# Patient Record
Sex: Male | Born: 1940 | Race: White | Hispanic: No | Marital: Married | Smoking: Former smoker
Health system: Southern US, Community
[De-identification: ages and names within clinical notes are randomized; demographics above are authoritative.]

---

## 2020-09-18 DIAGNOSIS — H532 Diplopia: Secondary | ICD-10-CM | POA: Diagnosis not present

## 2020-09-18 DIAGNOSIS — M79672 Pain in left foot: Secondary | ICD-10-CM | POA: Diagnosis not present

## 2020-09-18 DIAGNOSIS — R42 Dizziness and giddiness: Secondary | ICD-10-CM | POA: Diagnosis not present

## 2020-10-24 DIAGNOSIS — R079 Chest pain, unspecified: Secondary | ICD-10-CM | POA: Diagnosis not present

## 2020-10-24 DIAGNOSIS — I25119 Atherosclerotic heart disease of native coronary artery with unspecified angina pectoris: Secondary | ICD-10-CM | POA: Diagnosis not present

## 2020-10-24 DIAGNOSIS — Z951 Presence of aortocoronary bypass graft: Secondary | ICD-10-CM | POA: Diagnosis not present

## 2020-10-24 DIAGNOSIS — I10 Essential (primary) hypertension: Secondary | ICD-10-CM | POA: Diagnosis not present

## 2020-10-24 DIAGNOSIS — E785 Hyperlipidemia, unspecified: Secondary | ICD-10-CM | POA: Diagnosis not present

## 2020-10-24 DIAGNOSIS — R42 Dizziness and giddiness: Secondary | ICD-10-CM | POA: Diagnosis not present

## 2020-12-23 DIAGNOSIS — M778 Other enthesopathies, not elsewhere classified: Secondary | ICD-10-CM | POA: Diagnosis not present

## 2021-06-26 DIAGNOSIS — R42 Dizziness and giddiness: Secondary | ICD-10-CM | POA: Diagnosis not present

## 2021-06-26 DIAGNOSIS — R079 Chest pain, unspecified: Secondary | ICD-10-CM | POA: Diagnosis not present

## 2021-06-26 DIAGNOSIS — Z951 Presence of aortocoronary bypass graft: Secondary | ICD-10-CM | POA: Diagnosis not present

## 2021-06-26 DIAGNOSIS — E785 Hyperlipidemia, unspecified: Secondary | ICD-10-CM | POA: Diagnosis not present

## 2021-06-26 DIAGNOSIS — I25119 Atherosclerotic heart disease of native coronary artery with unspecified angina pectoris: Secondary | ICD-10-CM | POA: Diagnosis not present

## 2021-06-26 DIAGNOSIS — I10 Essential (primary) hypertension: Secondary | ICD-10-CM | POA: Diagnosis not present

## 2021-08-20 NOTE — Progress Notes (Signed)
? ?Synopsis: Referred for severe obstructive lung disease by Olevia Bowens, FNP ? ?Subjective:  ? ?PATIENT ID: Shane Blevins GENDER: male DOB: [birth year], MRN: [id number] ? ?Chief Complaint  ?Patient presents with  ? Pulmonary Consult  ?  Referred by Eliezer Bottom, FNP.  Pt states having DOE for at least the year. He gets winded sometimes with exertion such as yard work and sometimes just walking.  He has occ cough- non prod.   ? ?81yM with history of CAD s/p CABG in 2001, smoking referred for recent spirometry showing severe obstructive lung disease. He was vaccinated for covid and doesn't think he at least ever tested positive for covid-19.  ? ?He can't tell a lot of difference from using advair which he does only use intermittently.  ? ?Winded with light activity over last couple of years.He doesn't think that increasing his ranexa to 500 BID has improved his DOE. He has no bothersome cough. He does have some frequent postnasal drainage.  ? ?Otherwise pertinent review of systems is negative. ? ?No family history of lung disease other than asthma ? ?He quit smoking 1968. He smoked 7-8 years up to 3 ppd. No other smoking. 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  ? ?History reviewed. No pertinent past medical history.  ? ?Family History  ?Problem Relation Age of Onset  ? Asthma Father   ? Asthma Sister   ? Asthma Brother   ?  ? ?History reviewed. No pertinent surgical history. ? ?Social History  ? ?Socioeconomic History  ? Marital status: Married  ?  Spouse name: Not on file  ? Number of children: Not on file  ? Years of education: Not on file  ? Highest education level: Not on file  ?Occupational History  ? Not on file  ?Tobacco Use  ? Smoking status: Former  ?  Packs/day: 2.00  ?  Years: 7.00  ?  Pack years: 14.00  ?  Types: Cigarettes  ?  Quit date: 05/31/1966  ?  Years since quitting: 55.2  ? Smokeless tobacco: Never  ?Vaping Use  ? Vaping Use: Never used  ?Substance and Sexual Activity  ?  Alcohol use: Not on file  ? Drug use: Not on file  ? Sexual activity: Not on file  ?Other Topics Concern  ? Not on file  ?Social History Narrative  ? Not on file  ? ?Social Determinants of Health  ? ?Financial Resource Strain: Not on file  ?Food Insecurity: Not on file  ?Transportation Needs: Not on file  ?Physical Activity: Not on file  ?Stress: Not on file  ?Social Connections: Not on file  ?Intimate Partner Violence: Not on file  ?  ? ?Not on File  ? ?Outpatient Medications Prior to Visit  ?Medication Sig Dispense Refill  ? amLODipine (NORVASC) 5 MG tablet Take 5 mg by mouth daily.    ? aspirin 81 MG chewable tablet Chew by mouth daily.    ? losartan (COZAAR) 50 MG tablet Take 50 mg by mouth daily.    ? metoprolol tartrate (LOPRESSOR) 50 MG tablet Take 50 mg by mouth daily.    ? ranolazine (RANEXA) 500 MG 12 hr tablet Take 500 mg by mouth 2 (two) times daily.    ? ?No facility-administered medications prior to visit.  ? ? ? ? ? ?Objective:  ? ?Physical Exam: ? ?General appearance: 81 y.o., male, NAD, conversant  ?Eyes: anicteric sclerae; PERRL, tracking appropriately ?HENT: NCAT; MMM ?Neck: Trachea midline; no  lymphadenopathy, no JVD ?Lungs: CTAB, no crackles, no wheeze, with normal respiratory effort ?CV: RRR, no murmur  ?Abdomen: Soft, non-tender; non-distended, BS present  ?Extremities: No peripheral edema, warm ?Skin: Normal turgor and texture; no rash ?Psych: Appropriate affect ?Neuro: Alert and oriented to person and place, no focal deficit  ? ? ? ?Vitals:  ? 08/21/21 1033  ?BP: 132/68  ?Pulse: 74  ?Temp: 98.2 ?F (36.8 ?C)  ?TempSrc: Oral  ?SpO2: 98%  ?Weight: 213 lb (96.6 kg)  ?Height: 6' (1.829 m)  ? ?98% on RA ?BMI Readings from Last 3 Encounters:  ?08/21/21 28.89 kg/m?  ? ?Wt Readings from Last 3 Encounters:  ?08/21/21 213 lb (96.6 kg)  ? ? ? ?CBC ?   ?Component Value Date/Time  ? WBC 9.7 08/21/2021 1132  ? RBC 4.91 08/21/2021 1132  ? HGB 14.6 08/21/2021 1132  ? HCT 43.6 08/21/2021 1132  ? PLT 188.0  08/21/2021 1132  ? MCV 88.8 08/21/2021 1132  ? MCHC 33.5 08/21/2021 1132  ? RDW 13.9 08/21/2021 1132  ? LYMPHSABS 3.3 08/21/2021 1132  ? MONOABS 0.8 08/21/2021 1132  ? EOSABS 0.3 08/21/2021 1132  ? BASOSABS 0.1 08/21/2021 1132  ? ?Eos 300 today ? ?Chest Imaging: ?Cxr 08/21/21 reviewed by me with prominent interstitial markings ? ? ?Pulmonary Functions Testing Results: ?   ? View : No data to display.  ?  ?  ?  ? ?OSH report reviewed by me 07/31/21: ? ? ? ? ?   ?Assessment & Plan:  ? ?# COPD gold functional group B ?Severe obstructive ventilatory defect. Eos 300. ? ?Plan: ?- IgE pending ?- stop advair ?- start trelegy 1 puff once daily, rinse mouth after use ?- albuterol as needed  ?- see you in 8 weeks ? ? ? ? ?Omar Person, MD ?[facility] ?08/21/2021 3:19 PM  ? ?

## 2021-08-21 ENCOUNTER — Other Ambulatory Visit: Payer: Self-pay | Admitting: Student

## 2021-08-21 ENCOUNTER — Ambulatory Visit (INDEPENDENT_AMBULATORY_CARE_PROVIDER_SITE_OTHER): Payer: PRIVATE HEALTH INSURANCE | Admitting: Student

## 2021-08-21 ENCOUNTER — Other Ambulatory Visit: Payer: Self-pay

## 2021-08-21 ENCOUNTER — Ambulatory Visit (INDEPENDENT_AMBULATORY_CARE_PROVIDER_SITE_OTHER): Payer: PRIVATE HEALTH INSURANCE

## 2021-08-21 ENCOUNTER — Encounter: Payer: Self-pay | Admitting: Student

## 2021-08-21 VITALS — BP 132/68 | HR 74 | Temp 98.2°F | Ht 72.0 in | Wt 213.0 lb

## 2021-08-21 DIAGNOSIS — J449 Chronic obstructive pulmonary disease, unspecified: Secondary | ICD-10-CM | POA: Diagnosis not present

## 2021-08-21 DIAGNOSIS — R0609 Other forms of dyspnea: Secondary | ICD-10-CM

## 2021-08-21 LAB — CBC WITH DIFFERENTIAL/PLATELET
Basophils Absolute: 0.1 10*3/uL (ref 0.0–0.1)
Basophils Relative: 0.8 % (ref 0.0–3.0)
Eosinophils Absolute: 0.3 10*3/uL (ref 0.0–0.7)
Eosinophils Relative: 3.1 % (ref 0.0–5.0)
HCT: 43.6 % (ref 39.0–52.0)
Hemoglobin: 14.6 g/dL (ref 13.0–17.0)
Lymphocytes Relative: 33.9 % (ref 12.0–46.0)
Lymphs Abs: 3.3 10*3/uL (ref 0.7–4.0)
MCHC: 33.5 g/dL (ref 30.0–36.0)
MCV: 88.8 fl (ref 78.0–100.0)
Monocytes Absolute: 0.8 10*3/uL (ref 0.1–1.0)
Monocytes Relative: 8.4 % (ref 3.0–12.0)
Neutro Abs: 5.2 10*3/uL (ref 1.4–7.7)
Neutrophils Relative %: 53.8 % (ref 43.0–77.0)
Platelets: 188 10*3/uL (ref 150.0–400.0)
RBC: 4.91 Mil/uL (ref 4.22–5.81)
RDW: 13.9 % (ref 11.5–15.5)
WBC: 9.7 10*3/uL (ref 4.0–10.5)

## 2021-08-21 MED ORDER — FLUTICASONE PROPIONATE 50 MCG/ACT NA SUSP: 1.0000 | Freq: Every day | NASAL | 2 refills | Status: AC

## 2021-08-21 MED ORDER — ALBUTEROL SULFATE HFA 108 (90 BASE) MCG/ACT IN AERS: 2.0000 | INHALATION_SPRAY | Freq: Four times a day (QID) | RESPIRATORY_TRACT | 11 refills | Status: AC | PRN

## 2021-08-21 MED ORDER — TRELEGY ELLIPTA 100-62.5-25 MCG/ACT IN AEPB: 1.0000 | INHALATION_SPRAY | Freq: Every day | RESPIRATORY_TRACT | 0 refills | Status: DC

## 2021-08-21 MED ORDER — TRELEGY ELLIPTA 100-62.5-25 MCG/ACT IN AEPB: 1.0000 | INHALATION_SPRAY | Freq: Every day | RESPIRATORY_TRACT | 11 refills | Status: AC

## 2021-08-21 NOTE — Patient Instructions (Addendum)
-   x ray and labs today ?- stop advair ?- start trelegy 1 puff once daily, rinse mouth after use ?- albuterol as needed  ?- see you in 8 weeks ?

## 2021-08-24 LAB — HOUSE ACCOUNT TRACKING

## 2021-08-24 LAB — IGE: IgE (Immunoglobulin E), Serum: 315 kU/L — ABNORMAL HIGH (ref <=114)

## 2021-09-02 ENCOUNTER — Other Ambulatory Visit (HOSPITAL_COMMUNITY): Payer: Self-pay

## 2021-09-02 ENCOUNTER — Telehealth: Payer: Self-pay | Admitting: Student

## 2021-09-02 NOTE — Telephone Encounter (Signed)
Spoke with the pt  ?He is asking if there is something we can do to get him a lower copay on his albuterol inhaler and trelegy inhaler  ?He states these used to be around $4 and now $30  ?Pharm team, is there anything that could be done? Thanks! ?

## 2021-09-03 ENCOUNTER — Other Ambulatory Visit (HOSPITAL_COMMUNITY): Payer: Self-pay

## 2021-09-03 DIAGNOSIS — M47818 Spondylosis without myelopathy or radiculopathy, sacral and sacrococcygeal region: Secondary | ICD-10-CM | POA: Diagnosis not present

## 2021-09-03 DIAGNOSIS — Z471 Aftercare following joint replacement surgery: Secondary | ICD-10-CM | POA: Diagnosis not present

## 2021-09-03 DIAGNOSIS — M25561 Pain in right knee: Secondary | ICD-10-CM | POA: Diagnosis not present

## 2021-09-03 DIAGNOSIS — Z87891 Personal history of nicotine dependence: Secondary | ICD-10-CM | POA: Diagnosis not present

## 2021-09-03 DIAGNOSIS — M170 Bilateral primary osteoarthritis of knee: Secondary | ICD-10-CM | POA: Diagnosis not present

## 2021-09-03 DIAGNOSIS — Z96643 Presence of artificial hip joint, bilateral: Secondary | ICD-10-CM | POA: Diagnosis not present

## 2021-09-03 DIAGNOSIS — I70208 Unspecified atherosclerosis of native arteries of extremities, other extremity: Secondary | ICD-10-CM | POA: Diagnosis not present

## 2021-09-03 DIAGNOSIS — M25562 Pain in left knee: Secondary | ICD-10-CM | POA: Diagnosis not present

## 2021-09-03 DIAGNOSIS — M8588 Other specified disorders of bone density and structure, other site: Secondary | ICD-10-CM | POA: Diagnosis not present

## 2021-09-03 NOTE — Telephone Encounter (Signed)
Shane Blevins, CPhT ?to Me   ?   5:55 PM ? Symbicort is $35. The insurances are always changing their formularies & copays. He may be eligible for financial assistance.  ?  ? ?Tresa Endo, he is on trelegy, not symbicort. Would it be the same for trelegy? ?

## 2021-09-03 NOTE — Telephone Encounter (Signed)
Remonia Richter, CPhT  Rosana Berger, CMA ?Caller: Unspecified (Yesterday,  3:17 PM) ?Yes, I believe so.  I ran a test claim & it's too soon to refill.  ? ? ?I called and spoke with the pt and he states he would like to fill out pt assistance forms for the Trelegy. I verified his address and have mailed these to him. He is aware to return to the office once complete. Nothing further needed.  ? ? ?

## 2021-10-14 NOTE — Progress Notes (Signed)
Synopsis: Referred for severe obstructive lung disease by No ref. provider found  Subjective:   PATIENT ID: Shane Blevins GENDER: male DOB: 22-Aug-1940, MRN: [id number]  Chief Complaint  Patient presents with   Follow-up    Breathing is about the same. He did not see benefit with trelegy so stopped taking it. He states main concern is PND.    80yM with history of CAD s/p CABG in 2001, smoking referred for recent spirometry showing severe obstructive lung disease. He was vaccinated for covid and doesn't think he at least ever tested positive for covid-19.   He can't tell a lot of difference from using advair which he does only use intermittently.   Winded with light activity over last couple of years.He doesn't think that increasing his ranexa to 500 BID has improved his DOE. He has no bothersome cough. He does have some frequent postnasal drainage.  No family history of lung disease other than asthma  He quit smoking 1968. He smoked 7-8 years up to 3 ppd. No other smoking. 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   Interval HPI: Started on trelegy last visit. Made his voice change and didn't make a ton of difference on his dyspnea.   Most bothersome issue however at present is postnasal drainage.  Otherwise pertinent review of systems is negative.  No past medical history on file.   Family History  Problem Relation Age of Onset   Asthma Father    Asthma Sister    Asthma Brother      No past surgical history on file.  Social History   Socioeconomic History   Marital status: Married    Spouse name: Not on file   Number of children: Not on file   Years of education: Not on file   Highest education level: Not on file  Occupational History   Not on file  Tobacco Use   Smoking status: Former    Packs/day: 2.00    Years: 7.00    Pack years: 14.00    Types: Cigarettes    Quit date: 05/31/1966    Years since quitting: 55.4   Smokeless tobacco: Never   Vaping Use   Vaping Use: Never used  Substance and Sexual Activity   Alcohol use: Not on file   Drug use: Not on file   Sexual activity: Not on file  Other Topics Concern   Not on file  Social History Narrative   Not on file   Social Determinants of Health   Financial Resource Strain: Not on file  Food Insecurity: Not on file  Transportation Needs: Not on file  Physical Activity: Not on file  Stress: Not on file  Social Connections: Not on file  Intimate Partner Violence: Not on file     No Known Allergies   Outpatient Medications Prior to Visit  Medication Sig Dispense Refill   albuterol (VENTOLIN HFA) 108 (90 Base) MCG/ACT inhaler Inhale 2 puffs into the lungs every 6 (six) hours as needed for wheezing or shortness of breath. 8 g 11   amLODipine (NORVASC) 5 MG tablet Take 5 mg by mouth daily.     aspirin 81 MG chewable tablet Chew by mouth daily.     fluticasone (FLONASE) 50 MCG/ACT nasal spray Place 1 spray into both nostrils daily. 16 g 2   Fluticasone-Umeclidin-Vilant (TRELEGY ELLIPTA) 100-62.5-25 MCG/ACT AEPB Inhale 1 puff into the lungs daily. 1 each 11   losartan (COZAAR) 50 MG tablet Take 50  mg by mouth daily.     metoprolol tartrate (LOPRESSOR) 50 MG tablet Take 50 mg by mouth daily.     ranolazine (RANEXA) 500 MG 12 hr tablet Take 500 mg by mouth 2 (two) times daily.     Fluticasone-Umeclidin-Vilant (TRELEGY ELLIPTA) 100-62.5-25 MCG/ACT AEPB Inhale 1 puff into the lungs daily. 14 each 0   No facility-administered medications prior to visit.       Objective:   Physical Exam:  General appearance: 81 y.o., male, NAD, conversant  Eyes: anicteric sclerae; PERRL, tracking appropriately HENT: NCAT; MMM Neck: Trachea midline; no lymphadenopathy, no JVD Lungs: CTAB, no crackles, no wheeze, with normal respiratory effort CV: RRR, no murmur  Abdomen: Soft, non-tender; non-distended, BS present  Extremities: No peripheral edema, warm Skin: Normal turgor and  texture; no rash Psych: Appropriate affect Neuro: Alert and oriented to person and place, no focal deficit     Vitals:   10/16/21 1007  BP: 124/72  Pulse: 72  Temp: 98.1 F (36.7 C)  TempSrc: Oral  SpO2: 99%  Weight: 210 lb (95.3 kg)  Height: 6' (1.829 m)    99% on RA BMI Readings from Last 3 Encounters:  10/16/21 28.48 kg/m  08/21/21 28.89 kg/m   Wt Readings from Last 3 Encounters:  10/16/21 210 lb (95.3 kg)  08/21/21 213 lb (96.6 kg)     CBC    Component Value Date/Time   WBC 9.7 08/21/2021 1132   RBC 4.91 08/21/2021 1132   HGB 14.6 08/21/2021 1132   HCT 43.6 08/21/2021 1132   PLT 188.0 08/21/2021 1132   MCV 88.8 08/21/2021 1132   MCHC 33.5 08/21/2021 1132   RDW 13.9 08/21/2021 1132   LYMPHSABS 3.3 08/21/2021 1132   MONOABS 0.8 08/21/2021 1132   EOSABS 0.3 08/21/2021 1132   BASOSABS 0.1 08/21/2021 1132   Eos 300 today  IgE 315  Chest Imaging: Cxr 08/24/21 reviewed by me with prominent interstitial markings   Pulmonary Functions Testing Results:     View : No data to display.         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 Eos 300.  # Postnasal drainage  Plan: - trial spiriva 2 puffs daily, will send message to pharmacy to see which laba/lama is most affordable (only have lama samples today in the office) - albuterol as needed  - after shower clear your nose of crusting/mucus, then use flonase 1 spray each nare for at least 2-3 weeks and until postnasal drainage resolved - can add claritin or similar nonsedating antihistamine if have ongoing postnasal drainage during allergy season - pulmonary rehab referral placed for [place] - see you in 6 months     Omar Person, MD [facility] 10/16/2021 10:31 AM

## 2021-10-16 ENCOUNTER — Other Ambulatory Visit (HOSPITAL_COMMUNITY): Payer: Self-pay

## 2021-10-16 ENCOUNTER — Ambulatory Visit (INDEPENDENT_AMBULATORY_CARE_PROVIDER_SITE_OTHER): Payer: PRIVATE HEALTH INSURANCE | Admitting: Student

## 2021-10-16 ENCOUNTER — Telehealth: Payer: Self-pay | Admitting: Student

## 2021-10-16 ENCOUNTER — Encounter: Payer: Self-pay | Admitting: Student

## 2021-10-16 VITALS — BP 124/72 | HR 72 | Temp 98.1°F | Ht 72.0 in | Wt 210.0 lb

## 2021-10-16 DIAGNOSIS — J449 Chronic obstructive pulmonary disease, unspecified: Secondary | ICD-10-CM

## 2021-10-16 DIAGNOSIS — J329 Chronic sinusitis, unspecified: Secondary | ICD-10-CM | POA: Diagnosis not present

## 2021-10-16 MED ORDER — SPIRIVA RESPIMAT 2.5 MCG/ACT IN AERS: 2.0000 | INHALATION_SPRAY | Freq: Every day | RESPIRATORY_TRACT | 0 refills | Status: AC

## 2021-10-16 NOTE — Telephone Encounter (Signed)
What would be least expensive laba/lama inhaler for him?

## 2021-10-16 NOTE — Patient Instructions (Addendum)
-   try spiriva 2 puffs once daily, I'll send message to the pharmacy to see which inhaler is ultimately most affordable that's similar - after shower clear your nose of crusting/mucus, then use flonase 1 spray each nare for at least 2-3 weeks and until postnasal drainage resolved - can add claritin or similar nonsedating antihistamine if have ongoing postnasal drainage during allergy season - pulmonary rehab referral placed for [facility]

## 2021-10-16 NOTE — Telephone Encounter (Signed)
Test billing results for LABA/LAMA are as follows: Anoro Ellipta: $35 Stiolto: $9.99 (eVoucher paid $25.01. Pt can get coupon online, or if pt pharmacy doesn't have this preloaded it will be $35) Bevespi: PA required

## 2021-11-10 DIAGNOSIS — J449 Chronic obstructive pulmonary disease, unspecified: Secondary | ICD-10-CM | POA: Diagnosis not present

## 2021-11-11 DIAGNOSIS — J449 Chronic obstructive pulmonary disease, unspecified: Secondary | ICD-10-CM | POA: Diagnosis not present

## 2021-11-13 DIAGNOSIS — J449 Chronic obstructive pulmonary disease, unspecified: Secondary | ICD-10-CM | POA: Diagnosis not present

## 2021-11-16 DIAGNOSIS — J449 Chronic obstructive pulmonary disease, unspecified: Secondary | ICD-10-CM | POA: Diagnosis not present

## 2021-11-18 DIAGNOSIS — J449 Chronic obstructive pulmonary disease, unspecified: Secondary | ICD-10-CM | POA: Diagnosis not present

## 2021-11-20 DIAGNOSIS — J449 Chronic obstructive pulmonary disease, unspecified: Secondary | ICD-10-CM | POA: Diagnosis not present

## 2021-11-23 DIAGNOSIS — J449 Chronic obstructive pulmonary disease, unspecified: Secondary | ICD-10-CM | POA: Diagnosis not present

## 2021-11-25 DIAGNOSIS — J449 Chronic obstructive pulmonary disease, unspecified: Secondary | ICD-10-CM | POA: Diagnosis not present

## 2021-11-27 DIAGNOSIS — J449 Chronic obstructive pulmonary disease, unspecified: Secondary | ICD-10-CM | POA: Diagnosis not present

## 2021-11-30 DIAGNOSIS — J449 Chronic obstructive pulmonary disease, unspecified: Secondary | ICD-10-CM | POA: Diagnosis not present

## 2021-12-02 DIAGNOSIS — J449 Chronic obstructive pulmonary disease, unspecified: Secondary | ICD-10-CM | POA: Diagnosis not present

## 2021-12-03 DIAGNOSIS — M170 Bilateral primary osteoarthritis of knee: Secondary | ICD-10-CM | POA: Diagnosis not present

## 2021-12-03 DIAGNOSIS — M25462 Effusion, left knee: Secondary | ICD-10-CM | POA: Diagnosis not present

## 2021-12-04 DIAGNOSIS — J449 Chronic obstructive pulmonary disease, unspecified: Secondary | ICD-10-CM | POA: Diagnosis not present

## 2021-12-07 DIAGNOSIS — J449 Chronic obstructive pulmonary disease, unspecified: Secondary | ICD-10-CM | POA: Diagnosis not present

## 2021-12-09 DIAGNOSIS — J449 Chronic obstructive pulmonary disease, unspecified: Secondary | ICD-10-CM | POA: Diagnosis not present

## 2021-12-11 DIAGNOSIS — J449 Chronic obstructive pulmonary disease, unspecified: Secondary | ICD-10-CM | POA: Diagnosis not present

## 2021-12-14 DIAGNOSIS — J449 Chronic obstructive pulmonary disease, unspecified: Secondary | ICD-10-CM | POA: Diagnosis not present

## 2021-12-16 DIAGNOSIS — J449 Chronic obstructive pulmonary disease, unspecified: Secondary | ICD-10-CM | POA: Diagnosis not present

## 2021-12-18 DIAGNOSIS — J449 Chronic obstructive pulmonary disease, unspecified: Secondary | ICD-10-CM | POA: Diagnosis not present

## 2021-12-21 DIAGNOSIS — J449 Chronic obstructive pulmonary disease, unspecified: Secondary | ICD-10-CM | POA: Diagnosis not present

## 2021-12-23 DIAGNOSIS — J449 Chronic obstructive pulmonary disease, unspecified: Secondary | ICD-10-CM | POA: Diagnosis not present

## 2021-12-25 DIAGNOSIS — I25119 Atherosclerotic heart disease of native coronary artery with unspecified angina pectoris: Secondary | ICD-10-CM | POA: Diagnosis not present

## 2021-12-25 DIAGNOSIS — R079 Chest pain, unspecified: Secondary | ICD-10-CM | POA: Diagnosis not present

## 2021-12-25 DIAGNOSIS — R42 Dizziness and giddiness: Secondary | ICD-10-CM | POA: Diagnosis not present

## 2021-12-25 DIAGNOSIS — E785 Hyperlipidemia, unspecified: Secondary | ICD-10-CM | POA: Diagnosis not present

## 2021-12-25 DIAGNOSIS — I10 Essential (primary) hypertension: Secondary | ICD-10-CM | POA: Diagnosis not present

## 2021-12-25 DIAGNOSIS — J449 Chronic obstructive pulmonary disease, unspecified: Secondary | ICD-10-CM | POA: Diagnosis not present

## 2021-12-25 DIAGNOSIS — Z951 Presence of aortocoronary bypass graft: Secondary | ICD-10-CM | POA: Diagnosis not present

## 2021-12-28 DIAGNOSIS — J449 Chronic obstructive pulmonary disease, unspecified: Secondary | ICD-10-CM | POA: Diagnosis not present

## 2021-12-30 DIAGNOSIS — J449 Chronic obstructive pulmonary disease, unspecified: Secondary | ICD-10-CM | POA: Diagnosis not present

## 2022-01-01 DIAGNOSIS — J449 Chronic obstructive pulmonary disease, unspecified: Secondary | ICD-10-CM | POA: Diagnosis not present

## 2022-01-04 DIAGNOSIS — J449 Chronic obstructive pulmonary disease, unspecified: Secondary | ICD-10-CM | POA: Diagnosis not present

## 2022-01-06 DIAGNOSIS — J449 Chronic obstructive pulmonary disease, unspecified: Secondary | ICD-10-CM | POA: Diagnosis not present

## 2022-01-08 DIAGNOSIS — J449 Chronic obstructive pulmonary disease, unspecified: Secondary | ICD-10-CM | POA: Diagnosis not present

## 2022-01-11 DIAGNOSIS — J449 Chronic obstructive pulmonary disease, unspecified: Secondary | ICD-10-CM | POA: Diagnosis not present

## 2022-01-13 DIAGNOSIS — J449 Chronic obstructive pulmonary disease, unspecified: Secondary | ICD-10-CM | POA: Diagnosis not present

## 2022-01-15 DIAGNOSIS — J449 Chronic obstructive pulmonary disease, unspecified: Secondary | ICD-10-CM | POA: Diagnosis not present

## 2022-01-18 DIAGNOSIS — J449 Chronic obstructive pulmonary disease, unspecified: Secondary | ICD-10-CM | POA: Diagnosis not present

## 2022-01-20 DIAGNOSIS — J449 Chronic obstructive pulmonary disease, unspecified: Secondary | ICD-10-CM | POA: Diagnosis not present

## 2022-01-22 DIAGNOSIS — J449 Chronic obstructive pulmonary disease, unspecified: Secondary | ICD-10-CM | POA: Diagnosis not present

## 2022-01-25 DIAGNOSIS — J449 Chronic obstructive pulmonary disease, unspecified: Secondary | ICD-10-CM | POA: Diagnosis not present

## 2022-01-27 DIAGNOSIS — J449 Chronic obstructive pulmonary disease, unspecified: Secondary | ICD-10-CM | POA: Diagnosis not present

## 2022-02-05 DIAGNOSIS — H5203 Hypermetropia, bilateral: Secondary | ICD-10-CM | POA: Diagnosis not present

## 2022-02-05 DIAGNOSIS — H25013 Cortical age-related cataract, bilateral: Secondary | ICD-10-CM | POA: Diagnosis not present

## 2022-02-05 DIAGNOSIS — H25811 Combined forms of age-related cataract, right eye: Secondary | ICD-10-CM | POA: Diagnosis not present

## 2022-02-05 DIAGNOSIS — H52203 Unspecified astigmatism, bilateral: Secondary | ICD-10-CM | POA: Diagnosis not present

## 2022-02-05 DIAGNOSIS — H524 Presbyopia: Secondary | ICD-10-CM | POA: Diagnosis not present

## 2022-02-05 DIAGNOSIS — H2513 Age-related nuclear cataract, bilateral: Secondary | ICD-10-CM | POA: Diagnosis not present

## 2022-02-05 DIAGNOSIS — H40003 Preglaucoma, unspecified, bilateral: Secondary | ICD-10-CM | POA: Diagnosis not present

## 2022-03-11 DIAGNOSIS — M25562 Pain in left knee: Secondary | ICD-10-CM | POA: Diagnosis not present

## 2022-03-11 DIAGNOSIS — M25462 Effusion, left knee: Secondary | ICD-10-CM | POA: Diagnosis not present

## 2022-03-11 DIAGNOSIS — M170 Bilateral primary osteoarthritis of knee: Secondary | ICD-10-CM | POA: Diagnosis not present

## 2022-03-11 DIAGNOSIS — M25561 Pain in right knee: Secondary | ICD-10-CM | POA: Diagnosis not present

## 2022-03-11 DIAGNOSIS — M8588 Other specified disorders of bone density and structure, other site: Secondary | ICD-10-CM | POA: Diagnosis not present

## 2022-03-11 DIAGNOSIS — I999 Unspecified disorder of circulatory system: Secondary | ICD-10-CM | POA: Diagnosis not present

## 2022-04-29 NOTE — Progress Notes (Deleted)
Synopsis: Referred for severe obstructive lung disease by No ref. provider found  Subjective:   PATIENT ID: Shane Blevins GENDER: male DOB: 20-Mar-1941, MRN: [id number]  No chief complaint on file.  80yM with history of CAD s/p CABG in 2001, smoking referred for recent spirometry showing severe obstructive lung disease. He was vaccinated for covid and doesn't think he at least ever tested positive for covid-19.   He can't tell a lot of difference from using advair which he does only use intermittently.   Winded with light activity over last couple of years.He doesn't think that increasing his ranexa to 500 BID has improved his DOE. He has no bothersome cough. He does have some frequent postnasal drainage.  No family history of lung disease other than asthma  He quit smoking 1968. He smoked 7-8 years up to 3 ppd. No other smoking. 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   Interval HPI: Started on flonase for PND last visit and tried out spiriva. Appears stiolto with evoucher or anoro are affordable options for laba/lama.   Has been workign with pulm rehab  Otherwise pertinent review of systems is negative.  No past medical history on file.   Family History  Problem Relation Age of Onset   Asthma Father    Asthma Sister    Asthma Brother      No past surgical history on file.  Social History   Socioeconomic History   Marital status: Married    Spouse name: Not on file   Number of children: Not on file   Years of education: Not on file   Highest education level: Not on file  Occupational History   Not on file  Tobacco Use   Smoking status: Former    Packs/day: 2.00    Years: 7.00    Total pack years: 14.00    Types: Cigarettes    Quit date: 05/31/1966    Years since quitting: 55.9   Smokeless tobacco: Never  Vaping Use   Vaping Use: Never used  Substance and Sexual Activity   Alcohol use: Not on file   Drug use: Not on file   Sexual activity:  Not on file  Other Topics Concern   Not on file  Social History Narrative   Not on file   Social Determinants of Health   Financial Resource Strain: Not on file  Food Insecurity: Not on file  Transportation Needs: Not on file  Physical Activity: Not on file  Stress: Not on file  Social Connections: Not on file  Intimate Partner Violence: Not on file     No Known Allergies   Outpatient Medications Prior to Visit  Medication Sig Dispense Refill   albuterol (VENTOLIN HFA) 108 (90 Base) MCG/ACT inhaler Inhale 2 puffs into the lungs every 6 (six) hours as needed for wheezing or shortness of breath. 8 g 11   amLODipine (NORVASC) 5 MG tablet Take 5 mg by mouth daily.     aspirin 81 MG chewable tablet Chew by mouth daily.     fluticasone (FLONASE) 50 MCG/ACT nasal spray Place 1 spray into both nostrils daily. 16 g 2   Fluticasone-Umeclidin-Vilant (TRELEGY ELLIPTA) 100-62.5-25 MCG/ACT AEPB Inhale 1 puff into the lungs daily. 1 each 11   losartan (COZAAR) 50 MG tablet Take 50 mg by mouth daily.     metoprolol tartrate (LOPRESSOR) 50 MG tablet Take 50 mg by mouth daily.     ranolazine (RANEXA) 500 MG 12 hr  tablet Take 500 mg by mouth 2 (two) times daily.     Tiotropium Bromide Monohydrate (SPIRIVA RESPIMAT) 2.5 MCG/ACT AERS Inhale 2 puffs into the lungs daily. 4 g 0   No facility-administered medications prior to visit.       Objective:   Physical Exam:  General appearance: 81 y.o., male, NAD, conversant  Eyes: anicteric sclerae; PERRL, tracking appropriately HENT: NCAT; MMM Neck: Trachea midline; no lymphadenopathy, no JVD Lungs: CTAB, no crackles, no wheeze, with normal respiratory effort CV: RRR, no murmur  Abdomen: Soft, non-tender; non-distended, BS present  Extremities: No peripheral edema, warm Skin: Normal turgor and texture; no rash Psych: Appropriate affect Neuro: Alert and oriented to person and place, no focal deficit     There were no vitals filed for this  visit.     on RA BMI Readings from Last 3 Encounters:  10/16/21 28.48 kg/m  08/21/21 28.89 kg/m   Wt Readings from Last 3 Encounters:  10/16/21 210 lb (95.3 kg)  08/21/21 213 lb (96.6 kg)     CBC    Component Value Date/Time   WBC 9.7 08/21/2021 1132   RBC 4.91 08/21/2021 1132   HGB 14.6 08/21/2021 1132   HCT 43.6 08/21/2021 1132   PLT 188.0 08/21/2021 1132   MCV 88.8 08/21/2021 1132   MCHC 33.5 08/21/2021 1132   RDW 13.9 08/21/2021 1132   LYMPHSABS 3.3 08/21/2021 1132   MONOABS 0.8 08/21/2021 1132   EOSABS 0.3 08/21/2021 1132   BASOSABS 0.1 08/21/2021 1132   Eos 300 today  IgE 315  Chest Imaging: Cxr 08/24/21 reviewed by me with prominent interstitial markings   Pulmonary Functions Testing Results:     No data to display         OSH report reviewed by me 07/31/21:        Assessment & Plan:   # 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 Eos 300.  # Postnasal drainage  Plan: - trial spiriva 2 puffs daily, will send message to pharmacy to see which laba/lama is most affordable (only have lama samples today in the office) - albuterol as needed  - after shower clear your nose of crusting/mucus, then use flonase 1 spray each nare for at least 2-3 weeks and until postnasal drainage resolved - can add claritin or similar nonsedating antihistamine if have ongoing postnasal drainage during allergy season - pulmonary rehab referral placed for [place] - see you in 6 months     Omar Person, MD [facility] 04/29/2022 9:32 AM

## 2022-04-30 ENCOUNTER — Ambulatory Visit: Payer: PRIVATE HEALTH INSURANCE | Admitting: Student

## 2022-06-03 DIAGNOSIS — H2511 Age-related nuclear cataract, right eye: Secondary | ICD-10-CM | POA: Diagnosis not present

## 2022-06-03 DIAGNOSIS — H25811 Combined forms of age-related cataract, right eye: Secondary | ICD-10-CM | POA: Diagnosis not present

## 2022-06-24 DIAGNOSIS — H2512 Age-related nuclear cataract, left eye: Secondary | ICD-10-CM | POA: Diagnosis not present

## 2022-06-24 DIAGNOSIS — H25812 Combined forms of age-related cataract, left eye: Secondary | ICD-10-CM | POA: Diagnosis not present

## 2022-12-27 DIAGNOSIS — L700 Acne vulgaris: Secondary | ICD-10-CM | POA: Diagnosis not present

## 2022-12-27 DIAGNOSIS — L821 Other seborrheic keratosis: Secondary | ICD-10-CM | POA: Diagnosis not present

## 2022-12-27 DIAGNOSIS — D485 Neoplasm of uncertain behavior of skin: Secondary | ICD-10-CM | POA: Diagnosis not present

## 2022-12-27 DIAGNOSIS — L578 Other skin changes due to chronic exposure to nonionizing radiation: Secondary | ICD-10-CM | POA: Diagnosis not present

## 2023-01-07 DIAGNOSIS — L570 Actinic keratosis: Secondary | ICD-10-CM | POA: Diagnosis not present

## 2023-03-10 DIAGNOSIS — M170 Bilateral primary osteoarthritis of knee: Secondary | ICD-10-CM | POA: Diagnosis not present

## 2023-03-14 IMAGING — DX DG CHEST 2V
2 series · 2 of 2 positions shown · non-contrast
Comparison: 05/19/2016

CLINICAL DATA: 80-year-old male with a history of dyspnea on
exertion

EXAM:
CHEST - 2 VIEW

[chest pa]
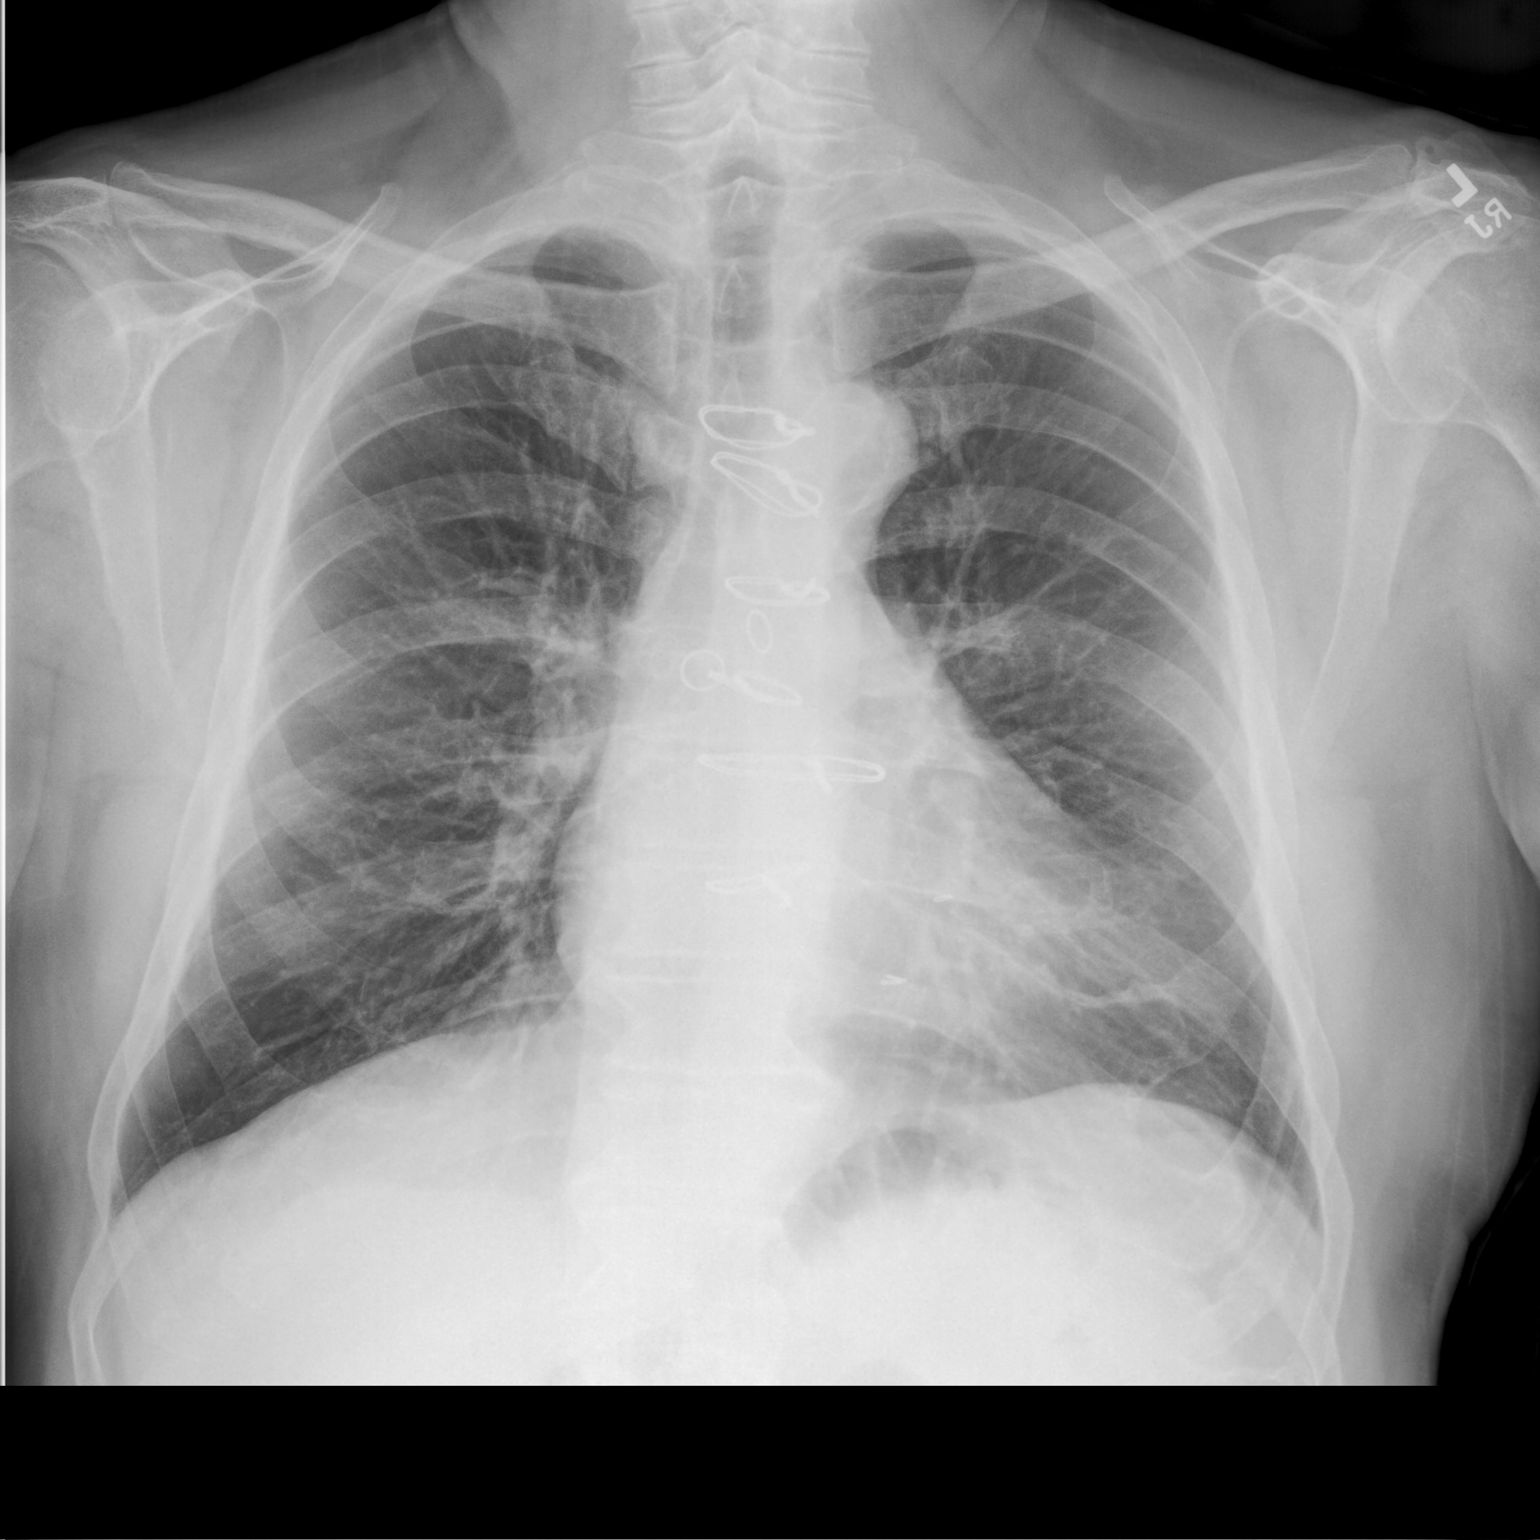

[chest lat]
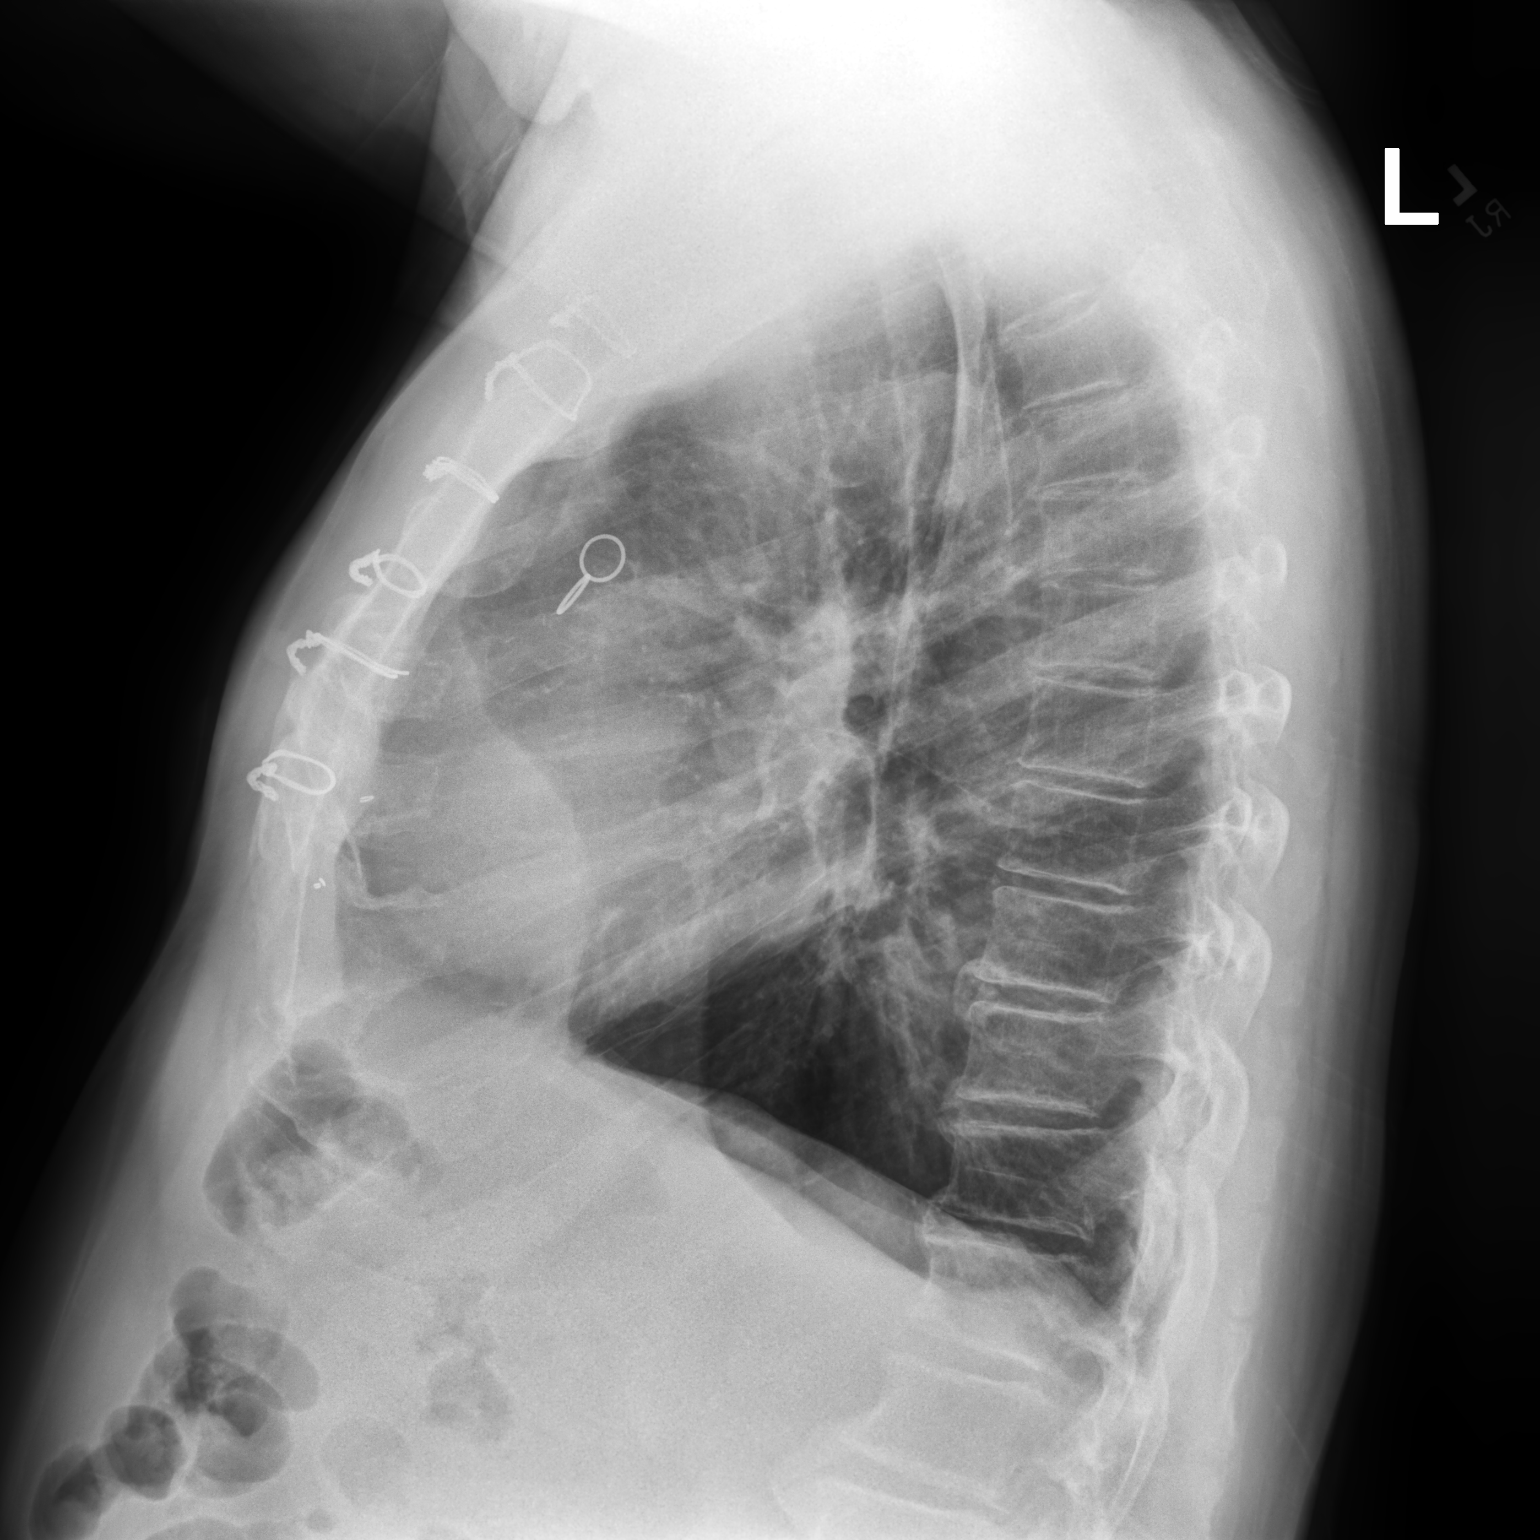

[2 of 2 positions shown; findings below may reference images not displayed]

FINDINGS: Cardiomediastinal silhouette unchanged in size and contour. Surgical
changes of median sternotomy and CABG.

Stigmata of emphysema, with increased retrosternal airspace,
flattened hemidiaphragms, increased AP diameter, and hyperinflation
on the AP view.

Coarsened interstitial markings. No confluent airspace disease
pneumothorax or pleural effusion.

Degenerative changes spine.  No displaced fracture
IMPRESSION: Emphysema and chronic changes without evidence of acute
cardiopulmonary disease.

Surgical changes of median sternotomy and CABG

## 2023-04-14 DIAGNOSIS — M5416 Radiculopathy, lumbar region: Secondary | ICD-10-CM | POA: Diagnosis not present

## 2023-05-06 DIAGNOSIS — L700 Acne vulgaris: Secondary | ICD-10-CM | POA: Diagnosis not present

## 2023-05-06 DIAGNOSIS — L570 Actinic keratosis: Secondary | ICD-10-CM | POA: Diagnosis not present

## 2023-05-20 DIAGNOSIS — M545 Low back pain, unspecified: Secondary | ICD-10-CM | POA: Diagnosis not present

## 2023-05-20 DIAGNOSIS — G8929 Other chronic pain: Secondary | ICD-10-CM | POA: Diagnosis not present

## 2023-05-20 DIAGNOSIS — M48062 Spinal stenosis, lumbar region with neurogenic claudication: Secondary | ICD-10-CM | POA: Diagnosis not present

## 2023-06-08 DIAGNOSIS — M4807 Spinal stenosis, lumbosacral region: Secondary | ICD-10-CM | POA: Diagnosis not present

## 2023-06-09 DIAGNOSIS — Z79899 Other long term (current) drug therapy: Secondary | ICD-10-CM | POA: Diagnosis not present

## 2023-06-09 DIAGNOSIS — M5416 Radiculopathy, lumbar region: Secondary | ICD-10-CM | POA: Diagnosis not present

## 2023-06-09 DIAGNOSIS — R2689 Other abnormalities of gait and mobility: Secondary | ICD-10-CM | POA: Diagnosis not present

## 2023-09-02 DIAGNOSIS — I10 Essential (primary) hypertension: Secondary | ICD-10-CM | POA: Diagnosis not present

## 2023-09-02 DIAGNOSIS — I25119 Atherosclerotic heart disease of native coronary artery with unspecified angina pectoris: Secondary | ICD-10-CM | POA: Diagnosis not present

## 2023-09-02 DIAGNOSIS — R079 Chest pain, unspecified: Secondary | ICD-10-CM | POA: Diagnosis not present

## 2023-09-02 DIAGNOSIS — R42 Dizziness and giddiness: Secondary | ICD-10-CM | POA: Diagnosis not present

## 2023-09-02 DIAGNOSIS — Z951 Presence of aortocoronary bypass graft: Secondary | ICD-10-CM | POA: Diagnosis not present

## 2023-09-02 DIAGNOSIS — E785 Hyperlipidemia, unspecified: Secondary | ICD-10-CM | POA: Diagnosis not present

## 2023-09-09 DIAGNOSIS — H52203 Unspecified astigmatism, bilateral: Secondary | ICD-10-CM | POA: Diagnosis not present

## 2023-09-09 DIAGNOSIS — H02834 Dermatochalasis of left upper eyelid: Secondary | ICD-10-CM | POA: Diagnosis not present

## 2023-09-09 DIAGNOSIS — H26493 Other secondary cataract, bilateral: Secondary | ICD-10-CM | POA: Diagnosis not present

## 2023-09-09 DIAGNOSIS — H5203 Hypermetropia, bilateral: Secondary | ICD-10-CM | POA: Diagnosis not present

## 2023-09-09 DIAGNOSIS — H524 Presbyopia: Secondary | ICD-10-CM | POA: Diagnosis not present

## 2023-09-09 DIAGNOSIS — H04123 Dry eye syndrome of bilateral lacrimal glands: Secondary | ICD-10-CM | POA: Diagnosis not present

## 2023-09-09 DIAGNOSIS — H02831 Dermatochalasis of right upper eyelid: Secondary | ICD-10-CM | POA: Diagnosis not present

## 2023-09-09 DIAGNOSIS — H35363 Drusen (degenerative) of macula, bilateral: Secondary | ICD-10-CM | POA: Diagnosis not present

## 2023-09-09 DIAGNOSIS — Z961 Presence of intraocular lens: Secondary | ICD-10-CM | POA: Diagnosis not present

## 2023-09-09 DIAGNOSIS — H43813 Vitreous degeneration, bilateral: Secondary | ICD-10-CM | POA: Diagnosis not present

## 2023-09-09 DIAGNOSIS — H40003 Preglaucoma, unspecified, bilateral: Secondary | ICD-10-CM | POA: Diagnosis not present

## 2023-12-09 DIAGNOSIS — R252 Cramp and spasm: Secondary | ICD-10-CM | POA: Diagnosis not present

## 2023-12-09 DIAGNOSIS — I25118 Atherosclerotic heart disease of native coronary artery with other forms of angina pectoris: Secondary | ICD-10-CM | POA: Diagnosis not present

## 2023-12-09 DIAGNOSIS — J449 Chronic obstructive pulmonary disease, unspecified: Secondary | ICD-10-CM | POA: Diagnosis not present

## 2023-12-09 DIAGNOSIS — I10 Essential (primary) hypertension: Secondary | ICD-10-CM | POA: Diagnosis not present

## 2023-12-09 DIAGNOSIS — E782 Mixed hyperlipidemia: Secondary | ICD-10-CM | POA: Diagnosis not present

## 2023-12-09 DIAGNOSIS — Z951 Presence of aortocoronary bypass graft: Secondary | ICD-10-CM | POA: Diagnosis not present

## 2024-03-28 ENCOUNTER — Telehealth: Payer: Self-pay

## 2024-03-28 NOTE — Telephone Encounter (Signed)
 Patient states that he is not a patient in [place]. He was given the information to the office in [place] and he will call to establish care.
